# Patient Record
Sex: Female | Born: 1995 | Race: Black or African American | Hispanic: No | Marital: Single | Smoking: Never smoker
Health system: Southern US, Community
[De-identification: ages and names within clinical notes are randomized; demographics above are authoritative.]

## PROBLEM LIST (undated history)

## (undated) DIAGNOSIS — Z98891 History of uterine scar from previous surgery: Secondary | ICD-10-CM

## (undated) DIAGNOSIS — J45909 Unspecified asthma, uncomplicated: Secondary | ICD-10-CM

---

## 2017-02-26 ENCOUNTER — Emergency Department (HOSPITAL_COMMUNITY)
Admission: EM | Admit: 2017-02-26 | Discharge: 2017-02-26 | Disposition: A | Discharge status: Home / Self Care | Payer: Medicaid - Out of State | Attending: Emergency Medicine | Admitting: Emergency Medicine

## 2017-02-26 ENCOUNTER — Encounter (HOSPITAL_COMMUNITY): Payer: Self-pay | Admitting: Emergency Medicine

## 2017-02-26 ENCOUNTER — Emergency Department (HOSPITAL_COMMUNITY): Payer: Medicaid - Out of State

## 2017-02-26 DIAGNOSIS — H53142 Visual discomfort, left eye: Secondary | ICD-10-CM | POA: Insufficient documentation

## 2017-02-26 DIAGNOSIS — Y998 Other external cause status: Secondary | ICD-10-CM | POA: Insufficient documentation

## 2017-02-26 DIAGNOSIS — S0592XA Unspecified injury of left eye and orbit, initial encounter: Secondary | ICD-10-CM | POA: Diagnosis present

## 2017-02-26 DIAGNOSIS — Y929 Unspecified place or not applicable: Secondary | ICD-10-CM | POA: Insufficient documentation

## 2017-02-26 DIAGNOSIS — S0502XA Injury of conjunctiva and corneal abrasion without foreign body, left eye, initial encounter: Secondary | ICD-10-CM | POA: Diagnosis not present

## 2017-02-26 DIAGNOSIS — H1132 Conjunctival hemorrhage, left eye: Secondary | ICD-10-CM | POA: Diagnosis not present

## 2017-02-26 DIAGNOSIS — Y939 Activity, unspecified: Secondary | ICD-10-CM | POA: Insufficient documentation

## 2017-02-26 DIAGNOSIS — S0083XA Contusion of other part of head, initial encounter: Secondary | ICD-10-CM

## 2017-02-26 DIAGNOSIS — J45909 Unspecified asthma, uncomplicated: Secondary | ICD-10-CM | POA: Insufficient documentation

## 2017-02-26 HISTORY — DX: Unspecified asthma, uncomplicated: J45.909

## 2017-02-26 LAB — POC URINE PREG, ED: PREG TEST UR: NEGATIVE

## 2017-02-26 MED ORDER — FLUORESCEIN SODIUM 1 MG OP STRP
1.0000 | ORAL_STRIP | Freq: Once | OPHTHALMIC | Status: AC
Start: 2017-02-26 — End: 2017-02-26
  Administered 2017-02-26: 1 via OPHTHALMIC
  Filled 2017-02-26: qty 1

## 2017-02-26 MED ORDER — FLUORESCEIN SODIUM 1 MG OP STRP
ORAL_STRIP | OPHTHALMIC | Status: AC
  Filled 2017-02-26: qty 1

## 2017-02-26 MED ORDER — OXYCODONE-ACETAMINOPHEN 5-325 MG PO TABS
1.0000 | ORAL_TABLET | Freq: Once | ORAL | Status: AC
  Administered 2017-02-26: 1 via ORAL
  Filled 2017-02-26: qty 1

## 2017-02-26 MED ORDER — PROPARACAINE HCL 0.5 % OP SOLN
1.0000 [drp] | Freq: Once | OPHTHALMIC | Status: AC
Start: 2017-02-26 — End: 2017-02-26
  Administered 2017-02-26: 1 [drp] via OPHTHALMIC
  Filled 2017-02-26: qty 15

## 2017-02-26 MED ORDER — OXYCODONE-ACETAMINOPHEN 5-325 MG PO TABS: 1.0000 | ORAL_TABLET | Freq: Four times a day (QID) | ORAL | 0 refills | Status: DC | PRN

## 2017-02-26 MED ORDER — ERYTHROMYCIN 5 MG/GM OP OINT
TOPICAL_OINTMENT | Freq: Once | OPHTHALMIC | Status: AC
  Administered 2017-02-26: 1 via OPHTHALMIC
  Filled 2017-02-26: qty 3.5
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
## 2017-02-26 NOTE — Discharge Instructions (Signed)
Please read and follow all provided instructions.  Your diagnoses today include:  1. Abrasion of left cornea, initial encounter   2. Subconjunctival hemorrhage of left eye   3. Contusion of face, initial encounter     Tests performed today include:  Visual acuity testing to check your vision  Fluorescein dye examination to look for scratches on your eye  CT scan of your face  Vital signs. See below for your results today.   Medications prescribed:   Percocet (oxycodone/acetaminophen) - narcotic pain medication  DO NOT drive or perform any activities that require you to be awake and alert because this medicine can make you drowsy. BE VERY CAREFUL not to take multiple medicines containing Tylenol (also called acetaminophen). Doing so can lead to an overdose which can damage your liver and cause liver failure and possibly death.  Take any prescribed medications only as directed.  Home care instructions:  Follow any educational materials contained in this packet.  You have a scratch of the eye on the cornea (the clear part of the eye). This condition may be caused by trauma. It is a common problem for people who wear contact lenses. Proper treatment is important. No evidence of infection is noted today, but you could develop an infection called a corneal ulcer or have some retained foreign body that may or may not have been noted today in the Emergency Department. Ulcers are not only painful, but they may also scar the cornea and cause permanent damage to the eye.   Follow-up instructions: Please follow-up with the eye doctor listed. Call the office first thing tomorrow, tell them you were seen in emergency department and need appointment for eye injury.  Return instructions:   Please return to the Emergency Department if you experience worsening symptoms.   Please return immediately if you develop severe pain, pus drainage, new change in vision, or fever.  Please return if you  have any other emergent concerns.  Additional Information:  Your vital signs today were: BP 122/77    Pulse 60    Temp 97.9 F (36.6 C) (Oral)    Resp 18    LMP  (LMP Unknown)    SpO2 98%  If your blood pressure (BP) was elevated above 135/85 this visit, please have this repeated by your doctor within one month.

## 2017-02-26 NOTE — ED Notes (Signed)
Pt to CT
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
## 2017-03-17 ENCOUNTER — Ambulatory Visit (HOSPITAL_COMMUNITY)
Admission: EM | Admit: 2017-03-17 | Discharge: 2017-03-17 | Disposition: A | Discharge status: Home / Self Care | Payer: Medicaid - Out of State | Attending: Emergency Medicine | Admitting: Emergency Medicine

## 2017-03-17 ENCOUNTER — Encounter (HOSPITAL_COMMUNITY): Payer: Self-pay | Admitting: Emergency Medicine

## 2017-03-17 DIAGNOSIS — J039 Acute tonsillitis, unspecified: Secondary | ICD-10-CM | POA: Diagnosis not present

## 2017-03-17 DIAGNOSIS — Z79899 Other long term (current) drug therapy: Secondary | ICD-10-CM | POA: Insufficient documentation

## 2017-03-17 DIAGNOSIS — H9201 Otalgia, right ear: Secondary | ICD-10-CM | POA: Insufficient documentation

## 2017-03-17 DIAGNOSIS — R509 Fever, unspecified: Secondary | ICD-10-CM | POA: Insufficient documentation

## 2017-03-17 DIAGNOSIS — J029 Acute pharyngitis, unspecified: Secondary | ICD-10-CM

## 2017-03-17 DIAGNOSIS — J45909 Unspecified asthma, uncomplicated: Secondary | ICD-10-CM | POA: Insufficient documentation

## 2017-03-17 LAB — POCT RAPID STREP A: STREPTOCOCCUS, GROUP A SCREEN (DIRECT): NEGATIVE

## 2017-03-17 MED ORDER — ACETAMINOPHEN 325 MG PO TABS
ORAL_TABLET | ORAL | Status: AC
  Filled 2017-03-17: qty 2

## 2017-03-17 MED ORDER — ACETAMINOPHEN 325 MG PO TABS
650.0000 mg | ORAL_TABLET | Freq: Once | ORAL | Status: AC
  Administered 2017-03-17: 650 mg via ORAL

## 2017-03-17 MED ORDER — AMOXICILLIN 500 MG PO CAPS
1000.0000 mg | ORAL_CAPSULE | Freq: Two times a day (BID) | ORAL | 0 refills | Status: DC
Start: 2017-03-17 — End: 2017-08-11

## 2017-03-17 NOTE — Discharge Instructions (Signed)
Take the amoxicillin as directed. Take Tylenol 650 mg every 4 hours for fever. Try to drink plenty of fluids and stay well-hydrated.

## 2017-03-17 NOTE — ED Provider Notes (Signed)
[facility]    CSN: [id number] date & time: 03/17/17  1520     History   Chief Complaint Chief Complaint  Patient presents with  . Fever  . URI    HPI Theresa Hodge is a 21 y.o. female.   21 year old female presents with sore throat with right greater than the left side for one week. Is causing her to have an earache on the right in pain in the right side of the neck and face. Current temperature 103 on arrival. Pulse 123.      Past Medical History:  Diagnosis Date  . Asthma     There are no active problems to display for this patient.   History reviewed. No pertinent surgical history.  OB History    No data available       Home Medications    Prior to Admission medications   Medication Sig Start Date End Date Taking? Authorizing Provider  amoxicillin (AMOXIL) 500 MG capsule Take 2 capsules (1,000 mg total) by mouth 2 (two) times daily. 03/17/17   Hayden Rasmussen, NP    Family History History reviewed. No pertinent family history.  Social History Social History  Substance Use Topics  . Smoking status: Never Smoker  . Smokeless tobacco: Never Used  . Alcohol use Yes     Comment: occ     Allergies   Patient has no known allergies.   Review of Systems Review of Systems  Constitutional: Positive for activity change and fever.  HENT: Positive for sore throat. Negative for congestion and postnasal drip.   Eyes: Negative.   Respiratory: Negative.   Gastrointestinal: Negative.   Neurological: Negative.   All other systems reviewed and are negative.    Physical Exam Triage Vital Signs ED Triage Vitals [03/17/17 1615]  Enc Vitals Group     BP (!) 95/57     Pulse Rate (!) 123     Resp 18     Temp (!) 103 F (39.4 C)     Temp Source Oral     SpO2 99 %     Weight      Height      Head Circumference      Peak Flow      Pain Score 10     Pain Loc      Pain Edu?      Excl. in GC?    No data found.   Updated Vital  Signs BP (!) 95/57 (BP Location: Right Arm)   Pulse (!) 123   Temp (!) 103 F (39.4 C) (Oral)   Resp 18   LMP  (LMP Unknown)   SpO2 99%   Visual Acuity Right Eye Distance:   Left Eye Distance:   Bilateral Distance:    Right Eye Near:   Left Eye Near:    Bilateral Near:     Physical Exam  Constitutional: She is oriented to person, place, and time. She appears well-developed and well-nourished. No distress.  HENT:  Bilateral TMs are normal. Oropharynx difficult to see due to tongue retraction however most of the tonsils were seen.They are red, mildly enlarged and covered with exudate. Posterior pharynx erythematous. Airway widely patent.  Eyes: EOM are normal.  Neck: Normal range of motion. Neck supple.  Cardiovascular: Regular rhythm and normal heart sounds.   Pulmonary/Chest: Effort normal and breath sounds normal. No respiratory distress.  Musculoskeletal: Normal range of motion.  Lymphadenopathy:    She has cervical adenopathy.  Neurological:  She is alert and oriented to person, place, and time.  Skin: Skin is warm and dry.  Nursing note and vitals reviewed.    UC Treatments / Results  Labs (all labs ordered are listed, but only abnormal results are displayed) Labs Reviewed  CULTURE, GROUP A STREP [facility])  POCT RAPID STREP A    EKG  EKG Interpretation None       Radiology No results found.  Procedures Procedures (including critical care time)  Medications Ordered in UC Medications  acetaminophen (TYLENOL) tablet 650 mg (650 mg Oral Given 03/17/17 1619)     Initial Impression / Assessment and Plan / UC Course  I have reviewed the triage vital signs and the nursing notes.  Pertinent labs & imaging results that were available during my care of the patient were reviewed by me and considered in my medical decision making (see chart for details).    Take the amoxicillin as directed. Take Tylenol 650 mg every 4 hours for fever. Try to drink plenty of  fluids and stay well-hydrated.    Final Clinical Impressions(s) / UC Diagnoses   Final diagnoses:  Exudative tonsillitis    New Prescriptions New Prescriptions   AMOXICILLIN (AMOXIL) 500 MG CAPSULE    Take 2 capsules (1,000 mg total) by mouth 2 (two) times daily.     Controlled Substance Prescriptions [place] Controlled Substance Registry consulted? Not Applicable   Hayden Rasmussen, NP 03/17/17 [phone]

## 2017-03-17 NOTE — ED Triage Notes (Signed)
Pt here for fever, URI sx with sore throat

## 2017-03-20 LAB — CULTURE, GROUP A STREP (THRC)

## 2017-08-11 ENCOUNTER — Encounter (HOSPITAL_COMMUNITY): Payer: Self-pay | Admitting: Emergency Medicine

## 2017-08-11 ENCOUNTER — Ambulatory Visit (HOSPITAL_COMMUNITY)
Admission: EM | Admit: 2017-08-11 | Discharge: 2017-08-11 | Disposition: A | Discharge status: Home / Self Care | Payer: Self-pay | Attending: Family Medicine | Admitting: Family Medicine

## 2017-08-11 DIAGNOSIS — N912 Amenorrhea, unspecified: Secondary | ICD-10-CM

## 2017-08-11 DIAGNOSIS — R0789 Other chest pain: Secondary | ICD-10-CM

## 2017-08-11 DIAGNOSIS — J45909 Unspecified asthma, uncomplicated: Secondary | ICD-10-CM | POA: Insufficient documentation

## 2017-08-11 DIAGNOSIS — Z3202 Encounter for pregnancy test, result negative: Secondary | ICD-10-CM

## 2017-08-11 DIAGNOSIS — R1084 Generalized abdominal pain: Secondary | ICD-10-CM

## 2017-08-11 HISTORY — DX: History of uterine scar from previous surgery: Z98.891

## 2017-08-11 LAB — POCT URINALYSIS DIP (DEVICE)
Bilirubin Urine: NEGATIVE
Glucose, UA: NEGATIVE mg/dL
HGB URINE DIPSTICK: NEGATIVE
KETONES UR: NEGATIVE mg/dL
NITRITE: NEGATIVE
PH: 7 (ref 5.0–8.0)
Protein, ur: NEGATIVE mg/dL
SPECIFIC GRAVITY, URINE: 1.02 (ref 1.005–1.030)
Urobilinogen, UA: 1 mg/dL (ref 0.0–1.0)

## 2017-08-11 LAB — POCT PREGNANCY, URINE: Preg Test, Ur: NEGATIVE

## 2017-08-11 MED ORDER — GI COCKTAIL ~~LOC~~
30.0000 mL | Freq: Once | ORAL | Status: AC
  Administered 2017-08-11: 30 mL via ORAL

## 2017-08-11 MED ORDER — ESOMEPRAZOLE MAGNESIUM 20 MG PO CPDR: 20.0000 mg | DELAYED_RELEASE_CAPSULE | Freq: Every day | ORAL | 0 refills | Status: AC

## 2017-08-11 MED ORDER — MELOXICAM 7.5 MG PO TABS: 7.5000 mg | ORAL_TABLET | Freq: Every day | ORAL | 0 refills | Status: AC

## 2017-08-11 MED ORDER — GI COCKTAIL ~~LOC~~
ORAL | Status: AC
  Filled 2017-08-11: qty 30

## 2017-08-11 NOTE — Discharge Instructions (Signed)
Abdominal pain could be due to acid reflux, start Nexium as directed.  Avoid spicy and fried foods for now.  As discussed, there are some changes in your EKG that could be normal for you.  Please monitor closely for any changes such as increased chest pain when exercising, shortness of breath, feeling like your heart is beating out of your chest, nausea, vomiting, weakness, dizziness, passing out, go to the emergency department for further evaluation.  If you experience worsening abdominal pain, nausea, vomiting, hard to jump  or car rides worsens symptoms, go to the emergency department for further evaluation.  Otherwise she can follow-up with cardiology to evaluate for changes in your EKG.  Follow-up with GYN for further evaluation of no cycles.

## 2017-08-11 NOTE — ED Provider Notes (Signed)
[facility]    CSN: [id number] Arrival date & time: 08/11/17  1003     History   Chief Complaint Chief Complaint  Patient presents with  . Abdominal Pain    HPI Theresa Hodge is a 22 y.o. female.   22 year old female comes in for 2 day history of  chest pain and abdominal pain.  Patient states the pain alternates when the chest pain eased up, she experiences the abdominal pain and vice versa.  Describes both pain is sharp, with abdominal pain waxing and waning between sharp and dull.  States that she has not been eating much, but has noticed slight increase in pain when eating.  Denies nausea, vomiting, diarrhea.  Last BM this morning, no straining.  Usual bowel movements 3 times a week.  Denies relief of pain after bowel movement.  No other aggravating or alleviating factor noted.  Denies fever, chills, night sweats.  Denies URI symptoms such as cough, congestion, sore throat.  Denies urinary symptoms such as frequency, dysuria, hematuria.  States had 2 episodes of vaginal discharge the last week, denies itchiness/pain, denies current discharge, spotting.  She is sexually active with one female partner, no condom use.  She states that she usually gets Depot injections, last injection May 2018, states has not had any cycles since then, denies any spotting since then.      Past Medical History:  Diagnosis Date  . Asthma   . H/O cesarean section     There are no active problems to display for this patient.   History reviewed. No pertinent surgical history.  OB History    No data available       Home Medications    Prior to Admission medications   Medication Sig Start Date End Date Taking? Authorizing Provider  esomeprazole (NEXIUM) 20 MG capsule Take 1 capsule (20 mg total) by mouth daily for 14 days. 08/11/17 08/25/17  Belinda FisherYu, Raman Featherston V, PA-C  meloxicam (MOBIC) 7.5 MG tablet Take 1 tablet (7.5 mg total) by mouth daily. 08/11/17   Belinda FisherYu, Bladyn Tipps V, PA-C    Family History No  family history on file.  Social History Social History   Tobacco Use  . Smoking status: Never Smoker  . Smokeless tobacco: Never Used  Substance Use Topics  . Alcohol use: Yes    Comment: occ  . Drug use: No     Allergies   Tomato   Review of Systems Review of Systems  Reason unable to perform ROS: See HPI as above.     Physical Exam Triage Vital Signs ED Triage Vitals  Enc Vitals Group     BP 08/11/17 1112 107/73     Pulse Rate 08/11/17 1112 64     Resp 08/11/17 1112 16     Temp 08/11/17 1112 97.8 F (36.6 C)     Temp src --      SpO2 08/11/17 1112 100 %     Weight --      Height --      Head Circumference --      Peak Flow --      Pain Score 08/11/17 1114 8     Pain Loc --      Pain Edu? --      Excl. in GC? --    No data found.  Updated Vital Signs BP 107/73   Pulse 64   Temp 97.8 F (36.6 C)   Resp 16   LMP  (LMP Unknown)  SpO2 100%   Physical Exam  Constitutional: She is oriented to person, place, and time. She appears well-developed and well-nourished. No distress.  HENT:  Head: Normocephalic and atraumatic.  Eyes: Conjunctivae are normal. Pupils are equal, round, and reactive to light.  Cardiovascular: Normal rate, regular rhythm and normal heart sounds. Exam reveals no gallop and no friction rub.  No murmur heard. Pulmonary/Chest: Effort normal and breath sounds normal. She has no wheezes. She has no rales. She exhibits tenderness (sternal and right).  Abdominal: Soft. Bowel sounds are normal. She exhibits no mass. There is tenderness in the epigastric area. There is no rigidity, no rebound, no guarding, no CVA tenderness, no tenderness at McBurney's point and negative Murphy's sign.  Neurological: She is alert and oriented to person, place, and time.  Skin: Skin is warm and dry.  Psychiatric: She has a normal mood and affect. Her behavior is normal. Judgment normal.    UC Treatments / Results  Labs (all labs ordered are listed, but  only abnormal results are displayed) Labs Reviewed  POCT URINALYSIS DIP (DEVICE) - Abnormal; Notable for the following components:      Result Value   Leukocytes, UA TRACE (*)    All other components within normal limits  POCT PREGNANCY, URINE  URINE CYTOLOGY ANCILLARY ONLY    EKG  EKG Interpretation None       Radiology No results found.  Procedures Procedures (including critical care time)  Medications Ordered in UC Medications  gi cocktail (Maalox,Lidocaine,Donnatal) (30 mLs Oral Given 08/11/17 1238)     Initial Impression / Assessment and Plan / UC Course  I have reviewed the triage vital signs and the nursing notes.  Pertinent labs & imaging results that were available during my care of the patient were reviewed by me and considered in my medical decision making (see chart for details).    Patient states pain improved with GI cocktail. EKG with NSR, bradycardia at 53bpm, diffuse T wave inversion. No comparison available. Patient denies chest pain during EKG/after GI cocktail. Without shortness of breath, palpitations, weakness, dizziness, syncope. Urine negative for pregnancy and UTI. Cytology sent. Will start patient on nexium for GERD given improvement with GI cocktail. Follow up with GYN for further evaluation of amenorrhea. Follow up with cardiologist for further evaluation for abnormal EKG. Strict return precautions given.  Case discussed with Dr Tracie Harrier, who agrees to plan.   Final Clinical Impressions(s) / UC Diagnoses   Final diagnoses:  Generalized abdominal pain  Atypical chest pain  Amenorrhea    ED Discharge Orders        Ordered    esomeprazole (NEXIUM) 20 MG capsule  Daily     08/11/17 1310    meloxicam (MOBIC) 7.5 MG tablet  Daily     08/11/17 1310        [address] [place] 08/11/17 1418

## 2017-08-11 NOTE — ED Triage Notes (Signed)
Pt c/o epigastric pain and abdominal pain since yesterday, c/o generalized abdominal pain. Denies n/v/d.

## 2017-08-12 LAB — URINE CYTOLOGY ANCILLARY ONLY
Chlamydia: NEGATIVE
NEISSERIA GONORRHEA: NEGATIVE
TRICH (WINDOWPATH): NEGATIVE

## 2017-08-14 LAB — URINE CYTOLOGY ANCILLARY ONLY
BACTERIAL VAGINITIS: POSITIVE — AB
CANDIDA VAGINITIS: NEGATIVE

## 2017-08-18 ENCOUNTER — Telehealth (HOSPITAL_COMMUNITY): Payer: Self-pay | Admitting: *Deleted

## 2017-08-18 NOTE — Telephone Encounter (Signed)
Pt called to inform of test results from recent visit. Denies symptoms at this time. Instructed patient on no need for medication if symptoms are improving.  Encouraged patient to return for return of symptoms and to follow up with PCP.

## 2017-09-22 ENCOUNTER — Encounter: Payer: Self-pay | Admitting: Family Medicine
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
